# Patient Record
Sex: Male | Born: 1935 | Race: White | Hispanic: No
Health system: Southern US, Community
[De-identification: ages and names within clinical notes are randomized; demographics above are authoritative.]

---

## 2001-08-29 ENCOUNTER — Encounter: Payer: Self-pay | Admitting: Neurosurgery

## 2001-09-01 ENCOUNTER — Encounter: Payer: Self-pay | Admitting: Neurosurgery

## 2001-09-01 ENCOUNTER — Inpatient Hospital Stay (HOSPITAL_COMMUNITY): Admission: RE | Admit: 2001-09-01 | Discharge: 2001-09-02 | Payer: Self-pay | Admitting: Neurosurgery

## 2005-04-09 ENCOUNTER — Ambulatory Visit: Payer: Self-pay | Admitting: Internal Medicine

## 2005-08-02 ENCOUNTER — Emergency Department: Payer: Self-pay | Admitting: Emergency Medicine

## 2006-01-20 ENCOUNTER — Ambulatory Visit: Payer: Self-pay | Admitting: Cardiology

## 2006-02-18 ENCOUNTER — Ambulatory Visit: Payer: Self-pay | Admitting: Unknown Physician Specialty

## 2006-02-22 ENCOUNTER — Ambulatory Visit: Payer: Self-pay | Admitting: Specialist

## 2006-04-10 ENCOUNTER — Emergency Department: Payer: Self-pay | Admitting: Internal Medicine

## 2006-05-03 ENCOUNTER — Ambulatory Visit: Payer: Self-pay | Admitting: Unknown Physician Specialty

## 2006-07-05 ENCOUNTER — Ambulatory Visit: Payer: Self-pay | Admitting: Specialist

## 2006-08-15 ENCOUNTER — Emergency Department: Payer: Self-pay | Admitting: Emergency Medicine

## 2007-01-14 ENCOUNTER — Ambulatory Visit: Payer: Self-pay | Admitting: Internal Medicine

## 2007-02-07 ENCOUNTER — Ambulatory Visit: Payer: Self-pay | Admitting: Internal Medicine

## 2007-03-03 ENCOUNTER — Ambulatory Visit: Payer: Self-pay | Admitting: Vascular Surgery

## 2007-06-16 IMAGING — CT CT ANGIO AORTA RUNOFF
2 of 5 series · 9 of 16 positions shown, 10 images · non-contrast
Comparison: none

REASON FOR EXAM: claudication
COMMENTS:

[Series 4: upperrunoff 5.0 upper · axial · 0.76mm/px · z∈[+156,+606]mm · 4 of 151 slices shown, 5 images]
[im 31/151  soft-tissue]
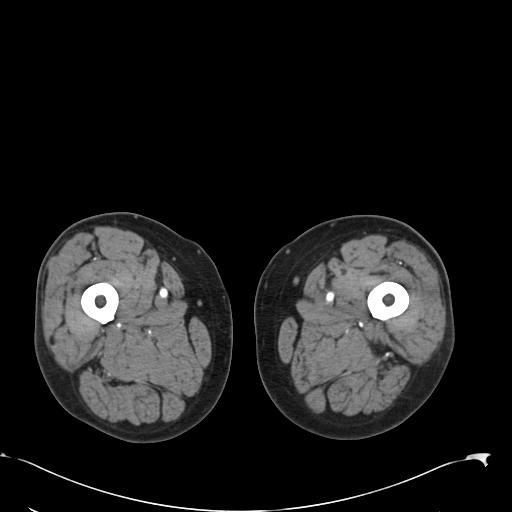
[im 31/151  bone]
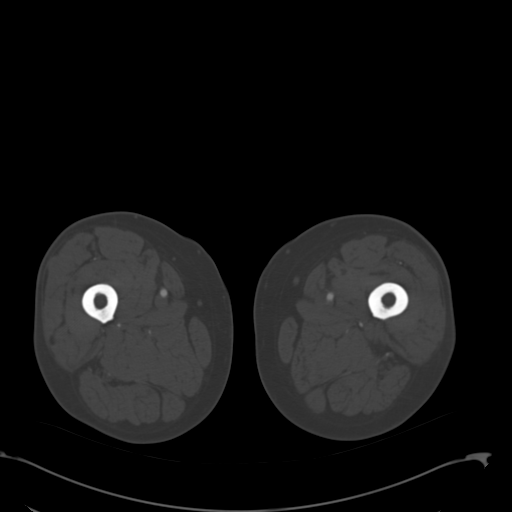
[im 61/151  soft-tissue]
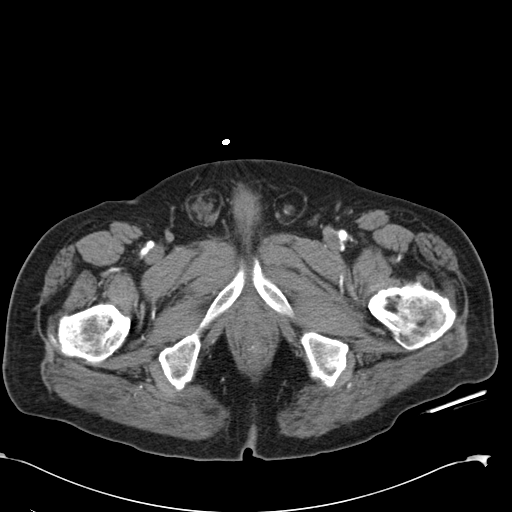
[im 91/151  soft-tissue]
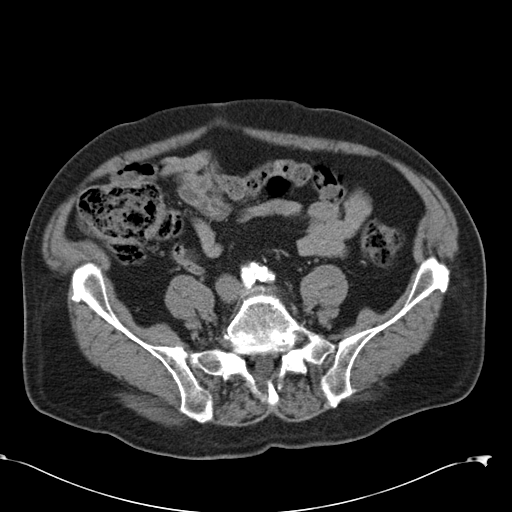
[im 121/151  soft-tissue]
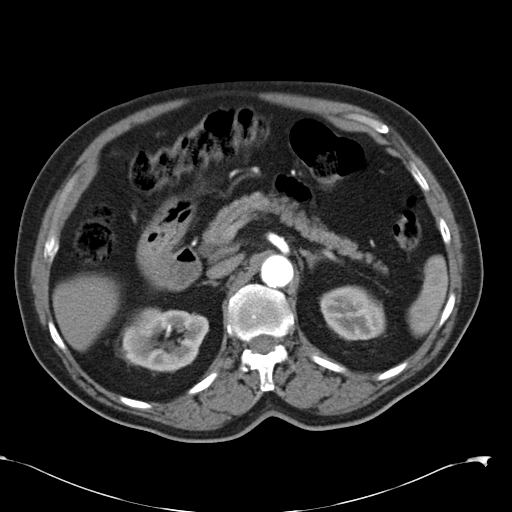

[Series 6: lowerrunoff 5.0 lower · axial · 0.77mm/px · z∈[-512,-37]mm · 5 of 143 slices shown]
[im 24/143  soft-tissue]
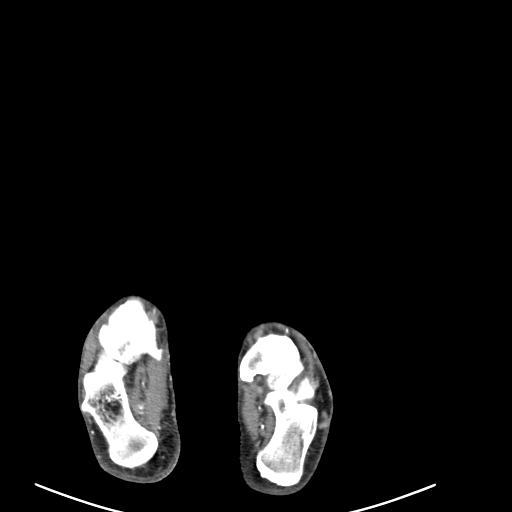
[im 48/143  soft-tissue]
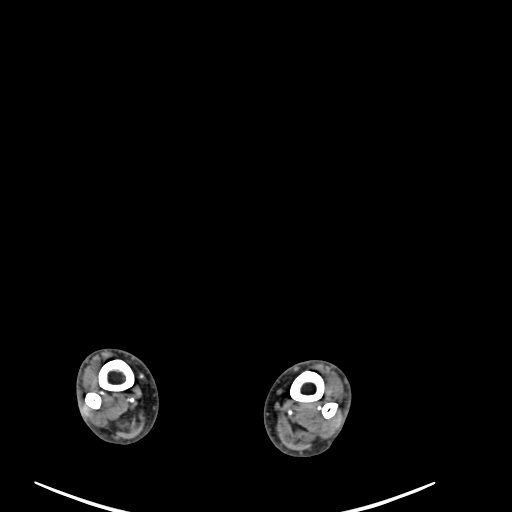
[im 72/143  soft-tissue]
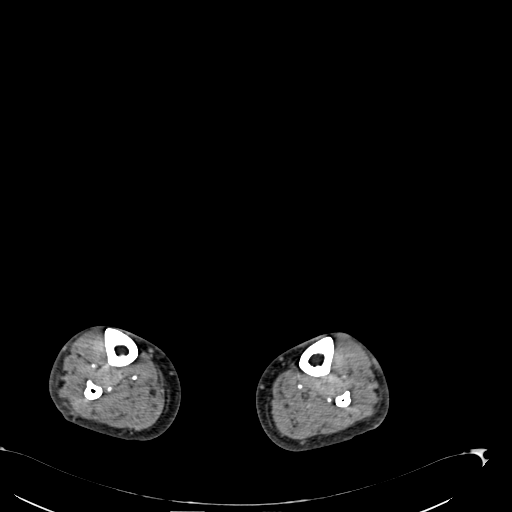
[im 95/143  soft-tissue]
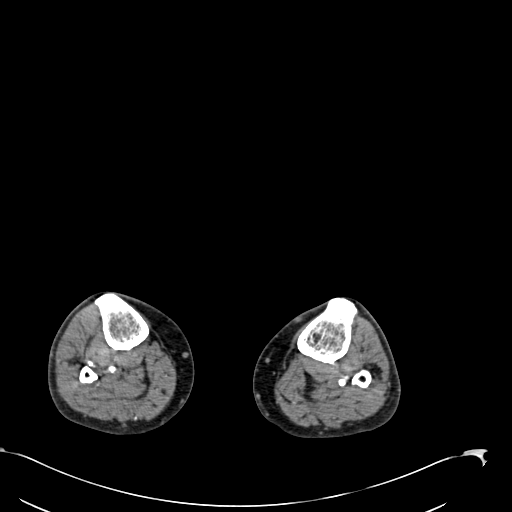
[im 119/143  soft-tissue]
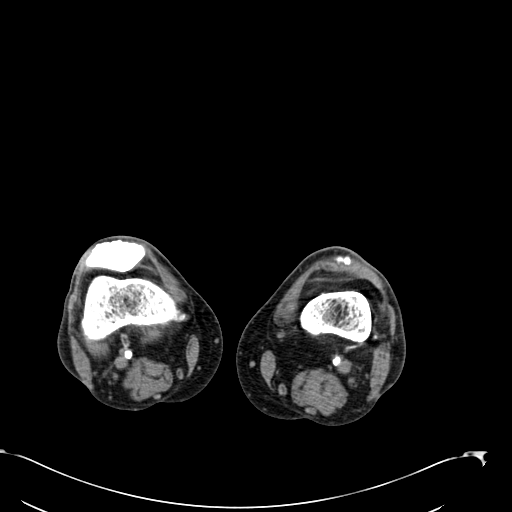

[9 of 16 positions shown; findings below may reference images not displayed]

PROCEDURE:     CT  - CT ANGIOGRAPHY AORTA RUNOFF  - February 07, 2007 [DATE]

RESULT:      Axial source images, 3D reconstructions and 2D maximum
intensity projections were obtained of the abdominal aorta, iliacs and
distal lower extremity vessels.

Evaluation of the abdominal aorta demonstrates diffuse calcified plaques
scattered throughout the aorta.  There is no evidence of an abdominal aortic
aneurysm.  Gross evaluation of the mesenteric vessels demonstrates no gross
abnormalities.  Limited evaluation of the RIGHT and LEFT renal arteries
demonstrates no gross abnormalities.  There does not appear to be evidence
of strictural narrowing within the visualized abdominal aorta.

Evaluation of the RIGHT and LEFT iliac vessels demonstrates coarse mural
calcifications within the RIGHT and LEFT common iliacs.  These
calcifications do partially obscure visualization of the vessel lumen.
Regions of high-grade stenosis particularly in the bifurcation areas at the
takeoff of the internal and external iliac vessels is a diagnostic
consideration which appear to be obscured by the coarse mural
calcifications.  Further evaluation with catheter-directed standard
angiography is recommended if clinically warranted.  Otherwise, there is
diffuse moderate to severe atherosclerotic disease throughout the RIGHT and
LEFT common iliacs. Evaluation of the RIGHT and LEFT internal iliac arteries
demonstrates an area of questionable moderate stenosis at the origin of the
RIGHT internal iliac and possible high-grade stenosis at the origin of the
LEFT internal iliac.  There is diffuse moderate to severe atherosclerotic
disease throughout the proximal and midportion of the vessels as well as
severe atherosclerotic disease within the perforating vessels.  Evaluation
of the external iliacs demonstrates diffuse mural calcifications throughout
both vessels with areas of moderate diffuse atherosclerotic disease.  The
common femoral arteries demonstrate diffuse mural calcifications with
moderate diffuse atherosclerotic disease.  The superficial femoral arteries
appear to be intact with evidence of mild atherosclerotic disease. There is
an area of moderate to severe stenosis at the origin of the profundal
femoral artery on the RIGHT.  Severe atherosclerotic disease is demonstrated
within the perforating vessels.  The LEFT profunda femoral artery appears to
be intact.  Evaluation of the RIGHT popliteal artery demonstrates moderate
diffuse atherosclerotic disease with areas of coarse mural calcifications.
The LEFT popliteal artery demonstrates a segmental area of moderate to
severe stenosis which appears to be on the magnitude of 45 to 60%. Coarse
mural calcifications are identified.  Evaluation of the trifurcation vessels
demonstrates severe atherosclerotic disease throughout the course of the
anterior tibial artery with what appears to be an area of high-grade
stenosis at the origin. This is on the RIGHT. Diffuse moderate to severe
atherosclerotic disease is appreciated involving the posterior
tibial/peroneal trunk with areas of mural calcifications. There is moderate
to severe atherosclerotic disease along the mid and distal portion of the
peroneal artery with occlusion above the level of the ankle. The posterior
tibial artery appears to be patent to and through the level of the ankle.
The LEFT trifurcation vessels demonstrate severe atherosclerotic disease
involving the anterior tibial artery with near complete occlusion of the
proximal portion of the vessel. Mild to moderate disease is demonstrated
throughout the posterior tibial artery which is patent to and through the
level of the ankle.  Severe disease is demonstrated within the distal aspect
of the peroneal artery with an area of complete occlusion along its distal
third with reconstitution just above the level of the ankle.
IMPRESSION: 1.     Diffuse atherosclerotic disease throughout the RIGHT and LEFT lower
extremities with severe atherosclerotic disease involving the trifurcation
vessels as described above particularly the anterior tibial arteries
bilaterally. There appears to be high-grade stenosis at the origin of these
vessels.
2.     Region of moderate to severe atherosclerotic disease is demonstrated
within the popliteal vessels as well as the distal perforating vessels of
the profunda femoral arteries and internal iliac arteries.  Area of
questionable high-grade stenosis is appreciated involving the origins of the
RIGHT and LEFT internal iliac arteries as well as the RIGHT profunda femoral
artery.  These areas are partially obscured by coarse mural calcifications.

3.     A region of moderate to mildly severe segmental narrowing is
demonstrated within the mid to distal portion of the LEFT popliteal artery.
This, too, is partially obscured by mural calcifications.

## 2007-10-21 ENCOUNTER — Emergency Department: Payer: Self-pay | Admitting: Emergency Medicine

## 2007-11-23 ENCOUNTER — Ambulatory Visit: Payer: Self-pay | Admitting: Internal Medicine

## 2009-05-17 ENCOUNTER — Inpatient Hospital Stay: Payer: Self-pay | Admitting: Internal Medicine

## 2009-05-30 ENCOUNTER — Inpatient Hospital Stay: Payer: Self-pay | Admitting: Internal Medicine

## 2009-07-01 ENCOUNTER — Inpatient Hospital Stay: Payer: Self-pay | Admitting: Rheumatology

## 2009-11-07 IMAGING — US ABDOMEN ULTRASOUND
1 series · 17 of 25 positions shown · non-contrast
Comparison: none

REASON FOR EXAM: abdominal pain
COMMENTS:

[Series 1: abdomen ultrasound · 17 of 68 slices shown]
[im 1/68]
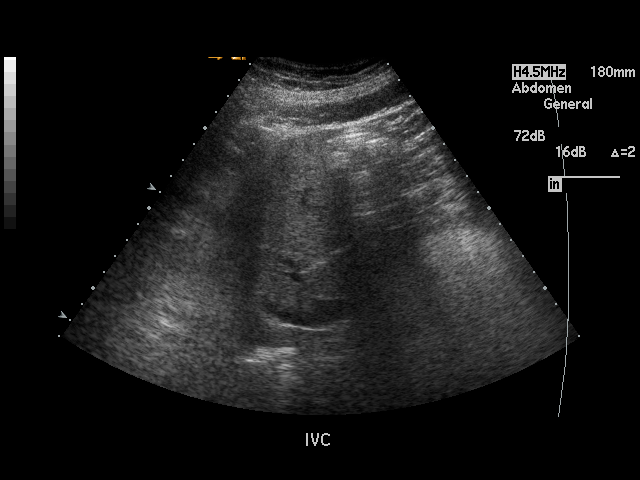
[im 6/68]
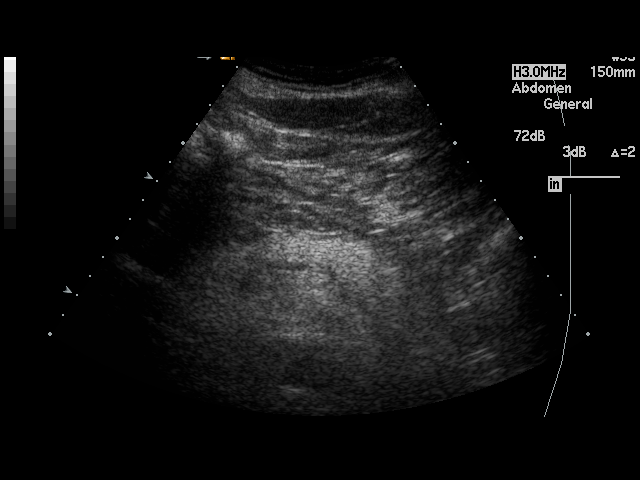
[im 9/68]
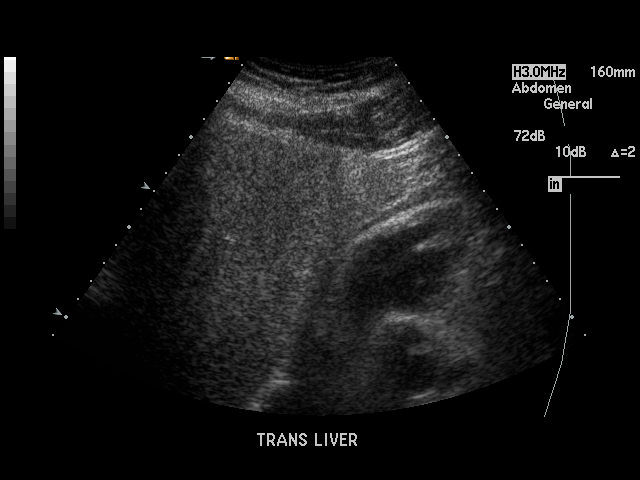
[im 14/68]
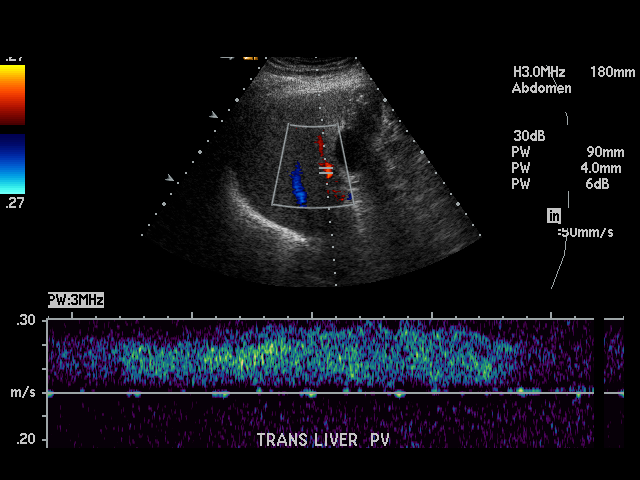
[im 17/68]
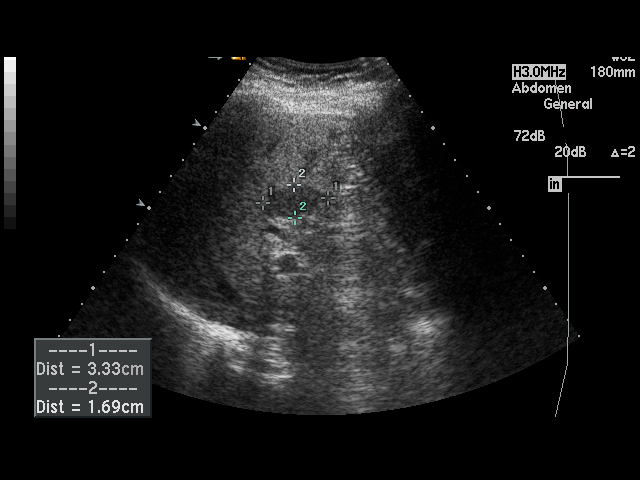
[im 23/68]
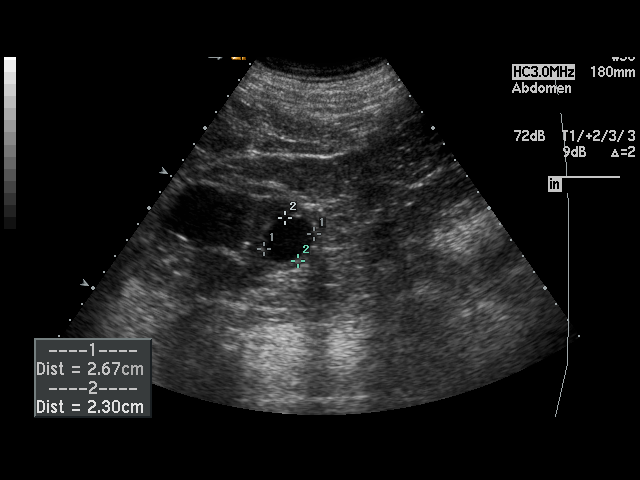
[im 26/68]
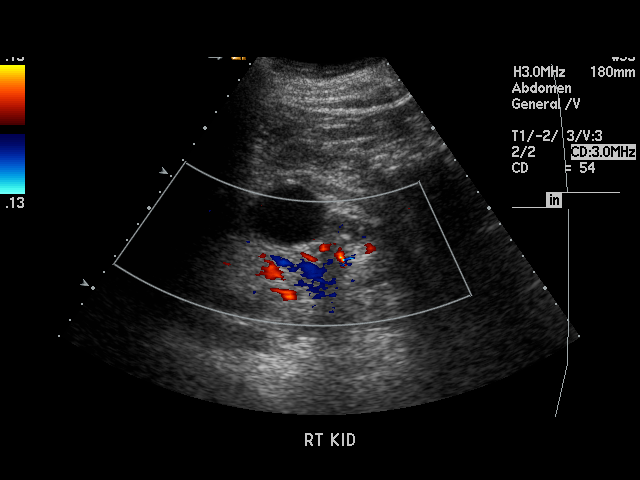
[im 31/68]
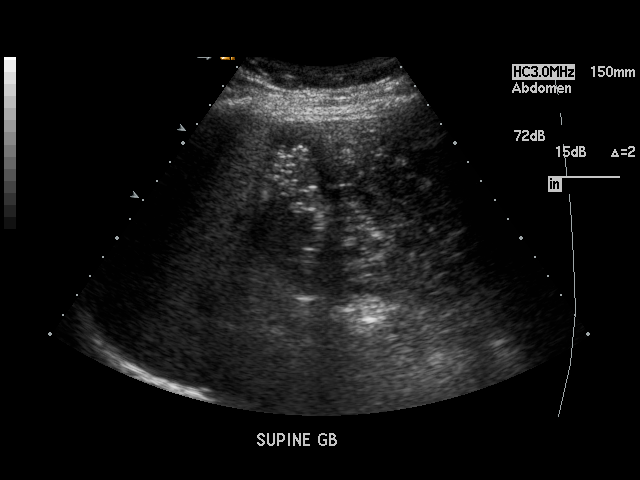
[im 34/68]
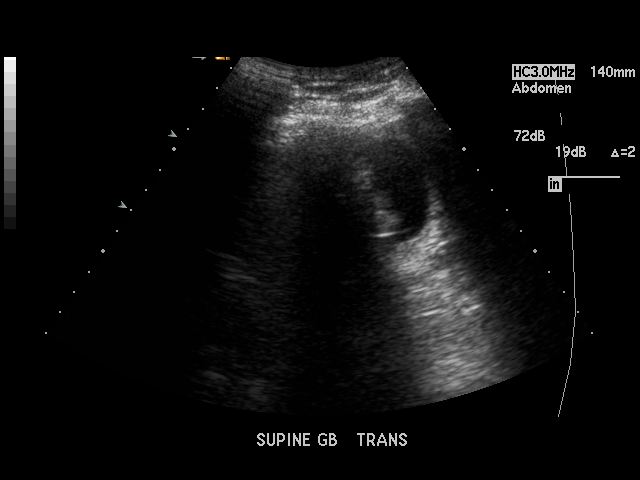
[im 37/68]
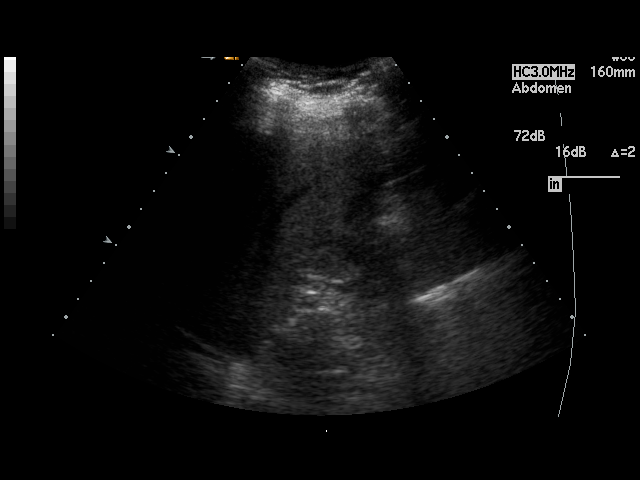
[im 42/68]
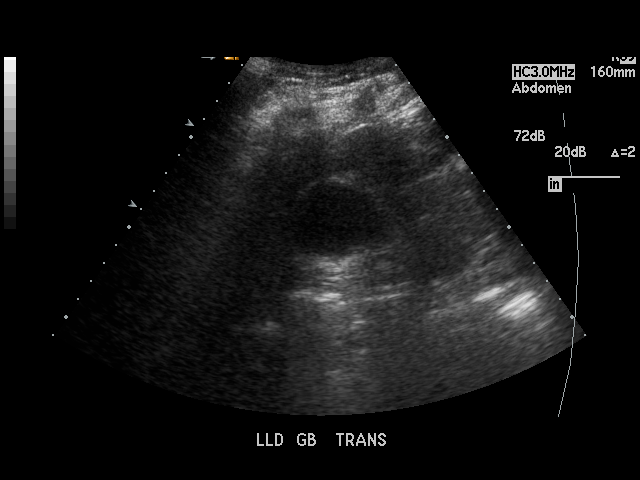
[im 45/68]
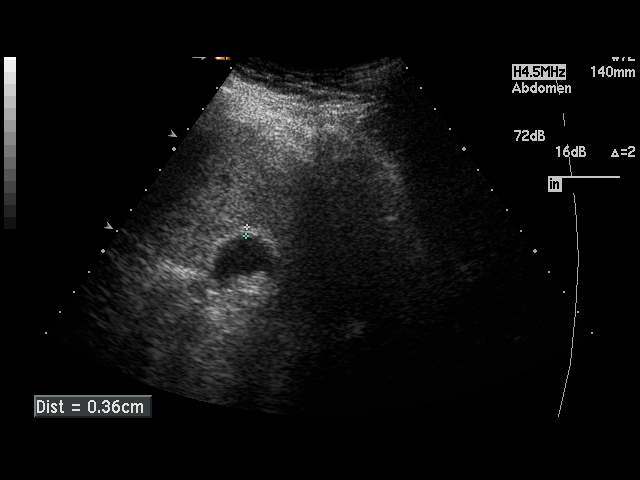
[im 51/68]
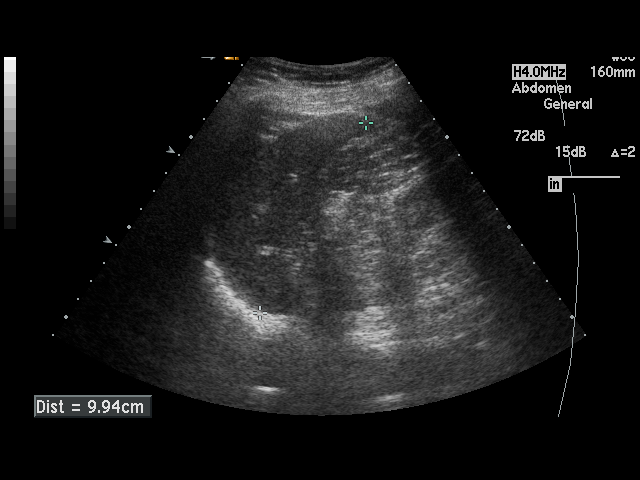
[im 54/68]
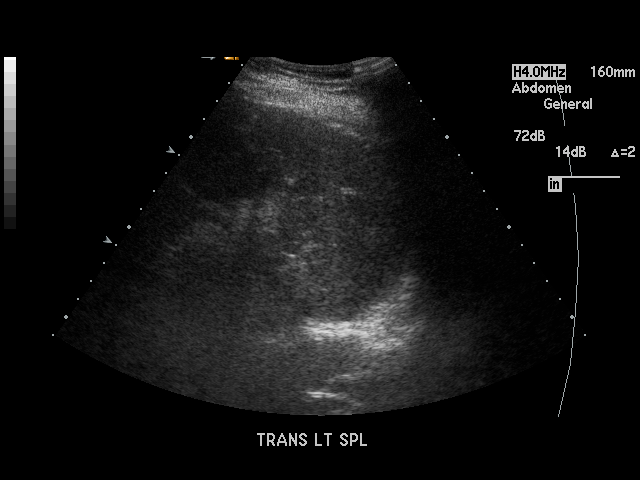
[im 59/68]
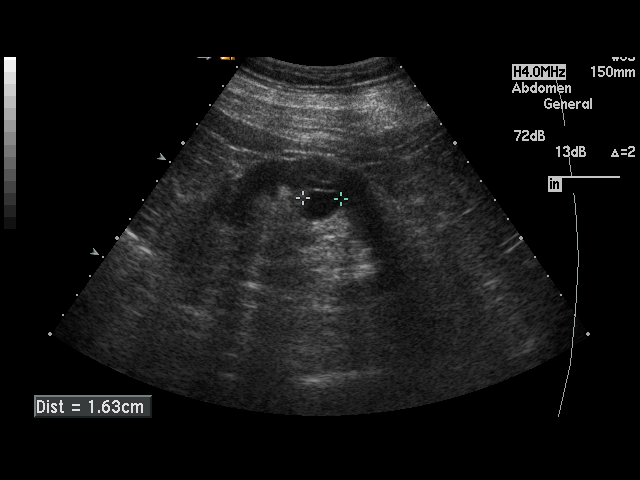
[im 62/68]
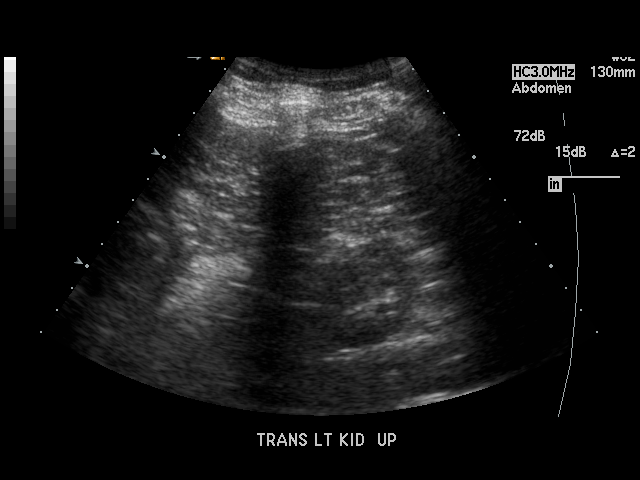
[im 68/68]
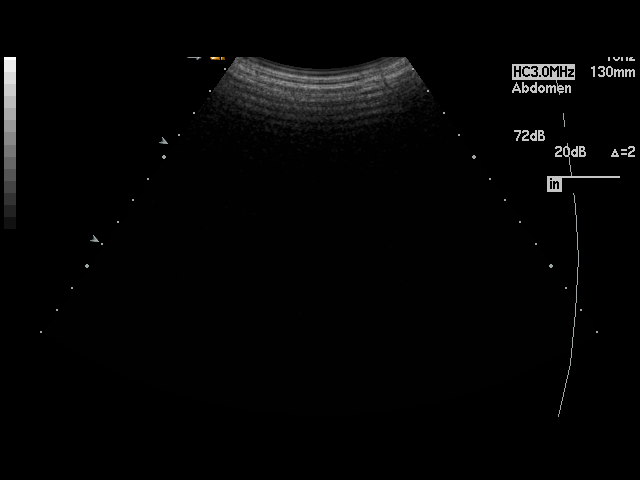

[17 of 25 positions shown; findings below may reference images not displayed]

PROCEDURE:     US  - US ABDOMEN GENERAL SURVEY  - July 01, 2009  [DATE]

RESULT:     Sonographic evaluation of the abdomen is performed. There is no
previous exam for comparison. Visualization of the areas of the aorta
submitted show a maximal AP dimension of 2.5 cm. The included images of the
pancreas show no gross abnormality. There is some low-attenuation in the
liver measuring 3.33 x 1.69 x 2.38 cm near the porta hepatis. Given the
echotexture of the liver is somewhat increased this may represent focal
fatty sparing. Consideration for further investigation with MR could be
given. CT could also be considered. The gallbladder wall shows some focal
thickening up to 4.0 mm. There are stones present within the gallbladder.
These appear to be mobile with changes in patient position. The common bile
duct diameter is 4.8 mm. Multiple cysts are seen in the kidneys. The largest
cyst on the right is 4.7 cm in maximal diameter. Left renal cysts are
present with the largest measuring up to 1.9 cm. There may be a septated
cyst in the lower pole area of the left kidney measuring up to 2.1 cm in
diameter.
IMPRESSION: 1. Fatty infiltration of the liver with an area of decreased echogenicity as
described in the porta hepatis region which may represent focal fatty
sparing. Further investigation is suggested. MRI or triphasic CT could be
considered. Given the septated cystic area in the lower pole the left
kidney, CT may be preferred.
2. Cholelithiasis. There is some focal gallbladder wall thickening to
mm. Correlate clinically. Developing cholecystitis is not completely
excluded. A hepatobiliary scan could be performed for further investigation.

## 2009-11-08 IMAGING — NM NUCLEAR MEDICINE HEPATOHBILIARY INCLUDE GB
1 series · 11 of 11 positions shown · non-contrast
Comparison: none

REASON FOR EXAM: abd pain/abnormal u/s
COMMENTS:

[Series 1000: gallbladder statics · 4.80mm/px · 11 of 11 slices shown]
[im 1/11]
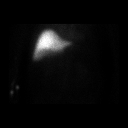
[im 2/11]
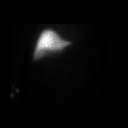
[im 3/11]
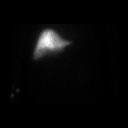
[im 4/11]
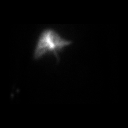
[im 5/11]
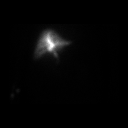
[im 6/11]
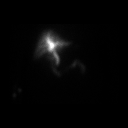
[im 7/11]
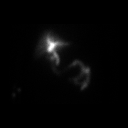
[im 8/11]
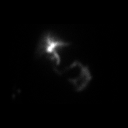
[im 9/11]
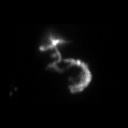
[im 10/11]
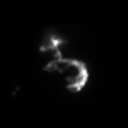
[im 11/11]
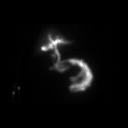

[11 of 11 positions shown; findings below may reference images not displayed]

PROCEDURE:     NM  - NM HEPATOBILIARY IMAGE  - July 02, 2009 [DATE]

RESULT:     The patient received 8.48 mCi of technetium 99m labeled Choletec
for this study. The patient has known gallstones.

There is adequate uptake of the radiopharmaceutical by the liver. Activity
is visible within the central hepatic ducts and common bile duct by
approximately 25 minutes. There may be a small diverticulum from the common
bile duct which  fills versus a small amount of bile reflux into the
duodenal bulb. Gallbladder activity becomes visible by approximately 30
minutes. Bowel activity is visible by 45 minutes.
IMPRESSION: I do not see evidence of cystic duct or common bile duct
obstruction. The patient did not receive CCK due to the presence of stones.
I cannot exclude a common bile duct diverticulum which demonstrates filling
versus reflux into the duodenal bulb.

It may be useful to consider the patient for CT scanning through the abdomen
following administration of oral contrast material only given the patient's
renal failure.  Alternatively if the patient is a candidate for MRI, an MRCP
could be considered.

## 2009-11-30 ENCOUNTER — Inpatient Hospital Stay: Payer: Self-pay | Admitting: Internal Medicine

## 2013-04-12 ENCOUNTER — Inpatient Hospital Stay: Payer: Self-pay | Admitting: Internal Medicine

## 2013-04-12 LAB — URINALYSIS, COMPLETE
Bacteria: NONE SEEN
Bilirubin,UR: NEGATIVE
Blood: NEGATIVE
Glucose,UR: NEGATIVE mg/dL (ref 0–75)
Hyaline Cast: 2
Ketone: NEGATIVE
Nitrite: NEGATIVE
Ph: 7 (ref 4.5–8.0)
Protein: 100
RBC,UR: 4 /HPF (ref 0–5)
Specific Gravity: 1.025 (ref 1.003–1.030)
Squamous Epithelial: <1
WBC UR: 28 /HPF (ref 0–5)

## 2013-04-12 LAB — BASIC METABOLIC PANEL
Anion Gap: 8 (ref 7–16)
BUN: 22 mg/dL — ABNORMAL HIGH (ref 7–18)
Calcium, Total: 9.1 mg/dL (ref 8.5–10.1)
Chloride: 105 mmol/L (ref 98–107)
Co2: 25 mmol/L (ref 21–32)
Creatinine: 1.15 mg/dL (ref 0.60–1.30)
EGFR (African American): >60
EGFR (Non-African Amer.): 58 — ABNORMAL LOW
Glucose: 140 mg/dL — ABNORMAL HIGH (ref 65–99)
Osmolality: 281 (ref 275–301)
Potassium: 4.3 mmol/L (ref 3.5–5.1)
Sodium: 138 mmol/L (ref 136–145)

## 2013-04-12 LAB — PRO B NATRIURETIC PEPTIDE: B-Type Natriuretic Peptide: 842 pg/mL — ABNORMAL HIGH (ref 0–450)

## 2013-04-12 LAB — CBC
HCT: 45.8 % (ref 40.0–52.0)
HGB: 15.4 g/dL (ref 13.0–18.0)
MCH: 27.7 pg (ref 26.0–34.0)
MCHC: 33.7 g/dL (ref 32.0–36.0)
MCV: 82 fL (ref 80–100)
Platelet: 229 10*3/uL (ref 150–440)
RBC: 5.57 10*6/uL (ref 4.40–5.90)
RDW: 13.7 % (ref 11.5–14.5)
WBC: 18.2 10*3/uL — ABNORMAL HIGH (ref 3.8–10.6)

## 2013-04-12 LAB — CK TOTAL AND CKMB (NOT AT ARMC)
CK, Total: 61 U/L (ref 35–232)
CK, Total: 73 U/L (ref 35–232)
CK-MB: 1.4 ng/mL (ref 0.5–3.6)
CK-MB: 1.2 ng/mL (ref 0.5–3.6)

## 2013-04-12 LAB — TROPONIN I
Troponin-I: <0.02 ng/mL
Troponin-I: <0.02 ng/mL

## 2013-04-12 LAB — HEPATIC FUNCTION PANEL A (ARMC)
Albumin: 3.5 g/dL (ref 3.4–5.0)
Alkaline Phosphatase: 110 U/L (ref 50–136)
Bilirubin, Direct: 0.1 mg/dL (ref 0.00–0.20)
Bilirubin,Total: 0.4 mg/dL (ref 0.2–1.0)
SGOT(AST): 41 U/L — ABNORMAL HIGH (ref 15–37)
SGPT (ALT): 58 U/L (ref 12–78)
Total Protein: 7.3 g/dL (ref 6.4–8.2)

## 2013-04-13 LAB — BASIC METABOLIC PANEL
Calcium, Total: 8.6 mg/dL (ref 8.5–10.1)
Chloride: 107 mmol/L (ref 98–107)
Co2: 26 mmol/L (ref 21–32)
Creatinine: 1.15 mg/dL (ref 0.60–1.30)
Osmolality: 287 (ref 275–301)
Potassium: 4 mmol/L (ref 3.5–5.1)
Sodium: 140 mmol/L (ref 136–145)

## 2013-04-13 LAB — CBC WITH DIFFERENTIAL/PLATELET
Basophil #: 0 10*3/uL (ref 0.0–0.1)
Basophil %: 0.1 %
Eosinophil #: 0 10*3/uL (ref 0.0–0.7)
Eosinophil %: 0 %
HCT: 41.1 % (ref 40.0–52.0)
HGB: 13.8 g/dL (ref 13.0–18.0)
Lymphocyte #: 1.3 10*3/uL (ref 1.0–3.6)
Lymphocyte %: 6.1 %
MCH: 27.5 pg (ref 26.0–34.0)
MCHC: 33.7 g/dL (ref 32.0–36.0)
MCV: 82 fL (ref 80–100)
Monocyte #: 1.1 x10 3/mm — ABNORMAL HIGH (ref 0.2–1.0)
Monocyte %: 5.1 %
Neutrophil #: 18.8 10*3/uL — ABNORMAL HIGH (ref 1.4–6.5)
Neutrophil %: 88.7 %
Platelet: 209 10*3/uL (ref 150–440)
RBC: 5.03 10*6/uL (ref 4.40–5.90)
RDW: 13.8 % (ref 11.5–14.5)
WBC: 21.2 10*3/uL — ABNORMAL HIGH (ref 3.8–10.6)

## 2013-04-13 LAB — MAGNESIUM: Magnesium: 2 mg/dL

## 2013-04-14 LAB — WBC: WBC: 18.8 10*3/uL — ABNORMAL HIGH (ref 3.8–10.6)

## 2013-04-15 LAB — CBC WITH DIFFERENTIAL/PLATELET
Basophil #: 0 10*3/uL (ref 0.0–0.1)
Basophil %: 0.3 %
HGB: 14.5 g/dL (ref 13.0–18.0)
Lymphocyte #: 2.1 10*3/uL (ref 1.0–3.6)
Lymphocyte %: 17.3 %
MCH: 27.9 pg (ref 26.0–34.0)
MCHC: 34.5 g/dL (ref 32.0–36.0)
Monocyte %: 10.5 %
Neutrophil #: 8.5 10*3/uL — ABNORMAL HIGH (ref 1.4–6.5)
Neutrophil %: 71.8 %
Platelet: 240 10*3/uL (ref 150–440)
RBC: 5.21 10*6/uL (ref 4.40–5.90)
RDW: 14 % (ref 11.5–14.5)
WBC: 11.9 10*3/uL — ABNORMAL HIGH (ref 3.8–10.6)

## 2013-04-15 LAB — URINE CULTURE

## 2013-04-17 LAB — CULTURE, BLOOD (SINGLE)

## 2013-04-18 LAB — CULTURE, BLOOD (SINGLE)

## 2013-05-08 ENCOUNTER — Ambulatory Visit: Payer: Self-pay | Admitting: Internal Medicine

## 2013-05-14 ENCOUNTER — Inpatient Hospital Stay: Payer: Self-pay | Admitting: Internal Medicine

## 2013-05-14 LAB — COMPREHENSIVE METABOLIC PANEL
Alkaline Phosphatase: 121 U/L (ref 50–136)
Anion Gap: 7 (ref 7–16)
Bilirubin,Total: 0.7 mg/dL (ref 0.2–1.0)
Calcium, Total: 9 mg/dL (ref 8.5–10.1)
Chloride: 103 mmol/L (ref 98–107)
Co2: 23 mmol/L (ref 21–32)
Glucose: 136 mg/dL — ABNORMAL HIGH (ref 65–99)
SGOT(AST): 36 U/L (ref 15–37)
SGPT (ALT): 33 U/L (ref 12–78)
Sodium: 133 mmol/L — ABNORMAL LOW (ref 136–145)
Total Protein: 8 g/dL (ref 6.4–8.2)

## 2013-05-14 LAB — URINALYSIS, COMPLETE
Bacteria: NONE SEEN
Bilirubin,UR: NEGATIVE
Blood: NEGATIVE
Glucose,UR: NEGATIVE mg/dL (ref 0–75)
Hyaline Cast: 2
Ketone: NEGATIVE
Nitrite: NEGATIVE
Specific Gravity: 1.025 (ref 1.003–1.030)
Squamous Epithelial: 1
WBC UR: 4 /HPF (ref 0–5)

## 2013-05-14 LAB — CBC
MCH: 28.3 pg (ref 26.0–34.0)
MCHC: 34.3 g/dL (ref 32.0–36.0)
MCV: 83 fL (ref 80–100)
Platelet: 337 10*3/uL (ref 150–440)
RBC: 5.4 10*6/uL (ref 4.40–5.90)
WBC: 10.3 10*3/uL (ref 3.8–10.6)

## 2013-05-14 LAB — CK TOTAL AND CKMB (NOT AT ARMC): CK, Total: 84 U/L (ref 35–232)

## 2013-05-15 LAB — CK TOTAL AND CKMB (NOT AT ARMC)
CK-MB: 1.3 ng/mL (ref 0.5–3.6)
CK-MB: 1.5 ng/mL (ref 0.5–3.6)

## 2013-05-15 LAB — BASIC METABOLIC PANEL
BUN: 20 mg/dL — ABNORMAL HIGH (ref 7–18)
Calcium, Total: 8.8 mg/dL (ref 8.5–10.1)
Chloride: 104 mmol/L (ref 98–107)
EGFR (African American): 57 — ABNORMAL LOW
Glucose: 143 mg/dL — ABNORMAL HIGH (ref 65–99)
Sodium: 136 mmol/L (ref 136–145)

## 2013-05-16 LAB — CBC WITH DIFFERENTIAL/PLATELET
Basophil %: 0.2 %
HCT: 39.2 % — ABNORMAL LOW (ref 40.0–52.0)
HGB: 13.4 g/dL (ref 13.0–18.0)
MCH: 27.8 pg (ref 26.0–34.0)
Monocyte %: 13.8 %
Neutrophil #: 8.1 10*3/uL — ABNORMAL HIGH (ref 1.4–6.5)
Neutrophil %: 69.1 %
Platelet: 331 10*3/uL (ref 150–440)
RBC: 4.82 10*6/uL (ref 4.40–5.90)
RDW: 14 % (ref 11.5–14.5)
WBC: 11.8 10*3/uL — ABNORMAL HIGH (ref 3.8–10.6)

## 2013-05-16 LAB — BASIC METABOLIC PANEL
Chloride: 102 mmol/L (ref 98–107)
Co2: 27 mmol/L (ref 21–32)
Creatinine: 1.19 mg/dL (ref 0.60–1.30)
EGFR (African American): >60
EGFR (Non-African Amer.): 56 — ABNORMAL LOW

## 2013-05-16 LAB — URINE CULTURE

## 2013-05-17 LAB — BASIC METABOLIC PANEL
Anion Gap: 6 — ABNORMAL LOW (ref 7–16)
Calcium, Total: 9.2 mg/dL (ref 8.5–10.1)
Chloride: 103 mmol/L (ref 98–107)
Co2: 28 mmol/L (ref 21–32)
Creatinine: 1.09 mg/dL (ref 0.60–1.30)
EGFR (Non-African Amer.): >60
Sodium: 137 mmol/L (ref 136–145)

## 2013-05-17 LAB — WBC: WBC: 10.3 10*3/uL (ref 3.8–10.6)

## 2013-05-18 LAB — CBC WITH DIFFERENTIAL/PLATELET
Basophil #: 0 10*3/uL (ref 0.0–0.1)
Basophil %: 0.4 %
Eosinophil %: 0 %
HGB: 14 g/dL (ref 13.0–18.0)
Lymphocyte #: 1.8 10*3/uL (ref 1.0–3.6)
Lymphocyte %: 17.4 %
MCH: 27.6 pg (ref 26.0–34.0)
MCV: 82 fL (ref 80–100)
Monocyte #: 1 x10 3/mm (ref 0.2–1.0)
Neutrophil #: 7.5 10*3/uL — ABNORMAL HIGH (ref 1.4–6.5)
Neutrophil %: 72.4 %
Platelet: 360 10*3/uL (ref 150–440)
RBC: 5.08 10*6/uL (ref 4.40–5.90)
RDW: 14.3 % (ref 11.5–14.5)
WBC: 10.4 10*3/uL (ref 3.8–10.6)

## 2013-05-18 LAB — BASIC METABOLIC PANEL
BUN: 21 mg/dL — ABNORMAL HIGH (ref 7–18)
Calcium, Total: 9.3 mg/dL (ref 8.5–10.1)
Chloride: 104 mmol/L (ref 98–107)
Creatinine: 1.1 mg/dL (ref 0.60–1.30)
EGFR (African American): >60
EGFR (Non-African Amer.): >60
Glucose: 112 mg/dL — ABNORMAL HIGH (ref 65–99)
Osmolality: 279 (ref 275–301)
Potassium: 4 mmol/L (ref 3.5–5.1)
Sodium: 138 mmol/L (ref 136–145)

## 2013-05-19 LAB — CULTURE, BLOOD (SINGLE)

## 2013-06-07 ENCOUNTER — Ambulatory Visit: Payer: Self-pay | Admitting: Internal Medicine

## 2013-08-21 LAB — COMPREHENSIVE METABOLIC PANEL
Albumin: 3.5 g/dL (ref 3.4–5.0)
Anion Gap: 4 — ABNORMAL LOW (ref 7–16)
BUN: 17 mg/dL (ref 7–18)
Bilirubin,Total: 0.4 mg/dL (ref 0.2–1.0)
Calcium, Total: 9 mg/dL (ref 8.5–10.1)
Chloride: 103 mmol/L (ref 98–107)
Co2: 30 mmol/L (ref 21–32)
EGFR (African American): >60
EGFR (Non-African Amer.): 55 — ABNORMAL LOW
Glucose: 105 mg/dL — ABNORMAL HIGH (ref 65–99)
Osmolality: 276 (ref 275–301)
Potassium: 4.3 mmol/L (ref 3.5–5.1)
SGOT(AST): 30 U/L (ref 15–37)
SGPT (ALT): 37 U/L (ref 12–78)
Sodium: 137 mmol/L (ref 136–145)
Total Protein: 7.2 g/dL (ref 6.4–8.2)

## 2013-08-21 LAB — CK TOTAL AND CKMB (NOT AT ARMC)
CK, Total: 50 U/L (ref 35–232)
CK-MB: 1.1 ng/mL (ref 0.5–3.6)

## 2013-08-21 LAB — CBC
MCH: 25.3 pg — ABNORMAL LOW (ref 26.0–34.0)
MCHC: 32.4 g/dL (ref 32.0–36.0)
MCV: 78 fL — ABNORMAL LOW (ref 80–100)
Platelet: 229 10*3/uL (ref 150–440)
RBC: 4.9 10*6/uL (ref 4.40–5.90)

## 2013-08-21 LAB — PRO B NATRIURETIC PEPTIDE: B-Type Natriuretic Peptide: 2540 pg/mL — ABNORMAL HIGH (ref 0–450)

## 2013-08-21 LAB — TROPONIN I: Troponin-I: <0.02 ng/mL

## 2013-08-22 ENCOUNTER — Inpatient Hospital Stay: Payer: Self-pay | Admitting: Internal Medicine

## 2013-08-22 LAB — CK-MB
CK-MB: 1.4 ng/mL (ref 0.5–3.6)
CK-MB: 1.7 ng/mL (ref 0.5–3.6)

## 2013-08-22 LAB — BASIC METABOLIC PANEL
Anion Gap: 5 — ABNORMAL LOW (ref 7–16)
BUN: 16 mg/dL (ref 7–18)
Calcium, Total: 9.6 mg/dL (ref 8.5–10.1)
Chloride: 103 mmol/L (ref 98–107)
Co2: 30 mmol/L (ref 21–32)
EGFR (African American): >60
EGFR (Non-African Amer.): 53 — ABNORMAL LOW
Osmolality: 277 (ref 275–301)
Potassium: 3.7 mmol/L (ref 3.5–5.1)
Sodium: 138 mmol/L (ref 136–145)

## 2013-08-22 LAB — TROPONIN I: Troponin-I: <0.02 ng/mL

## 2013-08-22 LAB — CK: CK, Total: 126 U/L (ref 35–232)

## 2013-08-22 LAB — TSH: Thyroid Stimulating Horm: 2.49 u[IU]/mL

## 2013-08-23 LAB — CBC WITH DIFFERENTIAL/PLATELET
Basophil #: 0.1 10*3/uL (ref 0.0–0.1)
Basophil %: 0.7 %
Eosinophil %: 1.9 %
HGB: 13.8 g/dL (ref 13.0–18.0)
Lymphocyte #: 3.5 10*3/uL (ref 1.0–3.6)
Lymphocyte %: 28.7 %
MCH: 25.7 pg — ABNORMAL LOW (ref 26.0–34.0)
MCHC: 33 g/dL (ref 32.0–36.0)
MCV: 78 fL — ABNORMAL LOW (ref 80–100)
Monocyte #: 1.3 x10 3/mm — ABNORMAL HIGH (ref 0.2–1.0)
Neutrophil #: 7.1 10*3/uL — ABNORMAL HIGH (ref 1.4–6.5)
Neutrophil %: 58.2 %
Platelet: 236 10*3/uL (ref 150–440)
RBC: 5.37 10*6/uL (ref 4.40–5.90)
RDW: 17.5 % — ABNORMAL HIGH (ref 11.5–14.5)
WBC: 12.1 10*3/uL — ABNORMAL HIGH (ref 3.8–10.6)

## 2013-08-23 LAB — BASIC METABOLIC PANEL
Anion Gap: 4 — ABNORMAL LOW (ref 7–16)
Calcium, Total: 9.8 mg/dL (ref 8.5–10.1)
Chloride: 99 mmol/L (ref 98–107)
Co2: 34 mmol/L — ABNORMAL HIGH (ref 21–32)
Creatinine: 1.37 mg/dL — ABNORMAL HIGH (ref 0.60–1.30)
Glucose: 96 mg/dL (ref 65–99)
Osmolality: 279 (ref 275–301)

## 2013-08-24 LAB — BASIC METABOLIC PANEL
Anion Gap: 7 (ref 7–16)
BUN: 38 mg/dL — ABNORMAL HIGH (ref 7–18)
Chloride: 100 mmol/L (ref 98–107)
Co2: 28 mmol/L (ref 21–32)
Creatinine: 1.26 mg/dL (ref 0.60–1.30)
Glucose: 106 mg/dL — ABNORMAL HIGH (ref 65–99)
Potassium: 4.2 mmol/L (ref 3.5–5.1)
Sodium: 135 mmol/L — ABNORMAL LOW (ref 136–145)

## 2013-11-05 DEATH — deceased

## 2014-12-28 NOTE — H&P (Signed)
PATIENT NAME:  Kristen CardinalCAMPBELL, Aahil MR#:  [id number] DATE OF BIRTH:  1926-10-05  DATE OF ADMISSION:  04/12/2013  PRIMARY CARE PHYSICIAN: Curtis SitesBert J. Klein III, MD  CHIEF COMPLAINT: Shortness of breath and fall.   HISTORY OF PRESENT ILLNESS: The patient is an [age] year old Caucasian male who has chronic history of COPD, lives on oxygen on as-needed basis, lives alone, who has been experiencing shortness of breath for 1 day. The patient does not like light, and he usually walks in the dark, and this morning, he sustained a fall, and he was unable to get up from the floor. He pushed his Lifeline, and EMS came by. Son lives just behind his home, and he also came with the EMS to the ER. The patient denies any chest pain, but complaining of shortness of breath. While coming to ER, he was placed on BiPAP machine. The patient started feeling comfortable while on BiPAP. Chest x-ray did not reveal any acute findings, but white count is elevated, and the patient has been having dry cough for the past few days. The patient continues to smoke and still smokes a pipe. Denies any chest pain, nausea, vomiting or abdominal pain. No other complaints. No sick contacts.   PAST MEDICAL HISTORY:  1. Coronary artery disease. 2. GERD. 3. Hyperlipidemia.  4. Hypertension. 5. Peripheral vascular disease.    PAST SURGICAL HISTORY:  1. Cholecystectomy.  2. Cataract repair.  3. Cervical spine surgery with diskectomy and fusion.   ALLERGIES: He has no known drug allergies.   PSYCHOSOCIAL HISTORY: Continues to smoke his pipe. Lives alone. No alcohol, no drugs.   FAMILY HISTORY: Mother had rheumatoid arthritis.   HOME MEDICATIONS:  1. Spiriva 18 mcg inhalation once daily. 2. Sertraline 100 mg once daily.  3. Ranitidine 150 mg twice a day. 4. Nitrostat 0.4 mg sublingually every 5 minutes x3 as needed for chest pain. 5. Xopenex 2 puff inhalation 4 times a day. 6. Lipitor 40 mg once daily. 7. Advair 250/50 two puff inhalation  twice a day.   REVIEW OF SYSTEMS: CONSTITUTIONAL: Denies any fever, fatigue, but the patient is very hard of hearing on the right side.  EYES: Denies blurry vision. Status post cataract surgery.  ENT: Denies epistaxis, discharge.  RESPIRATION: Complaining of cough, chronic history of COPD.  CARDIOVASCULAR: No chest pain, palpitations. Denies any dizziness.  GASTROINTESTINAL: No nausea, vomiting, diarrhea.  GENITOURINARY: No dysuria or hematuria. Denies any hernia. ENDOCRINE: No polyuria, nocturia. HEMATOLOGIC AND LYMPHATIC: Denies anemia or easy bruising.  MUSCULOSKELETAL: No joint pain in the neck or back. Denies any gout.  PSYCHIATRIC: No ADD, OCD, bipolar disorder. NEUROLOGIC: Denies any ataxia, dementia.   PHYSICAL EXAMINATION:  VITAL SIGNS: Blood pressure 141/93, respirations 20, pulse 88, pulse oximetry 98% on BiPAP.  GENERAL APPEARANCE: Not in acute distress. Moderately built and moderately nourished. Very hard of hearing on right side. HEENT: Normocephalic. Pupils are equally reacting to light and accommodation. No scleral icterus. No conjunctival injection. No sinus tenderness. No postnasal drip.  NECK: Supple. No JVD. No thyromegaly. Range of motion is intact.  LUNGS: Distant breath sounds. Diffuse wheezing is present. No accessory muscle usage. No anterior chest wall tenderness on palpation.  CARDIAC: S1, S2 normal. Regular rate and rhythm. No murmur.  GASTROINTESTINAL: Soft. Bowel sounds are positive in all 4 quadrants. Nontender, nondistended. No hepatosplenomegaly.  NEUROLOGIC: Awake, alert, oriented x3, but hard of hearing. Motor and sensory are grossly intact. Reflexes are 2+.  EXTREMITIES: No edema. No cyanosis. No clubbing.  SKIN: Warm to touch. Normal turgor. No rashes. No lesions.  MUSCULOSKELETAL: No joint effusion, tenderness or erythema.   DIAGNOSTIC STUDIES: Chest x-ray: No acute findings. A 12-lead EKG: Normal sinus rhythm with first-degree AV block. PR interval  296 msec. No ST-T wave changes. WBC 18.2, hemoglobin 15.4, hematocrit 45.8, platelets 229, MCV is 82. ABG on BiPAP: pH 7.39, pCO2 42, pO2 88, FiO2 35, base excess 0.3, bicarbonate 25.4. LFTs are normal except AST is slightly elevated at 41. CK total 61, CPK-MB 1.4, troponin less than 0.02. Glucose 140. BNP elevated at 842. BUN 22, creatinine 1.15, sodium 138, potassium 4.3, chloride 105, anion gap 8, calcium 9.1, serum osmolality 281.   ASSESSMENT AND PLAN: An 81 year old Caucasian male presenting to the ER with a chief complaint of shortness of breath. Still smokes a pipe. Started feeling better on BiPAP. Will be admitted with the following assessment and plan.   1. Acute exacerbation of chronic obstructive pulmonary disease with acute bronchitis. Admit to monitored floor with telemetry bed. Solu-Medrol will be given 60 mg IV q.6 hours, DuoNeb nebulizer treatments q.6 hours and albuterol p.r.n. The patient is placed on IV Rocephin and Zithromax to cover acute bronchitis.  2. History of coronary artery disease. No chest pain at this time. 3. Gastroesophageal reflux disease. Will provide gastrointestinal prophylaxis.  4. Hypertension. Will resume his home medications and titrate as needed.  5. The patient still smokes. Counseled to quit smoking.   CODE STATUS: The patient is DNR. Son is his medical power of attorney. His name is Corley Maffeo, contact number [phone], which is work number. If we call this number, they will page Mr. Yifan Auker.   Will transfer the patient to [facility] in a.m.   The plan of care was discussed in detail with the patient and son at bedside. They both verbalized understanding of the plan.   TOTAL TIME SPENT ON ADMISSION: 50 minutes.   ____________________________ Ramonita Lab, MD ag:OSi D: 04/12/2013 05:19:51 ET T: 04/12/2013 07:27:29 ET JOB#: [id number]  cc: Ramonita Lab, MD, <Dictator> Ramonita Lab MD ELECTRONICALLY SIGNED 04/18/2013 6:10

## 2014-12-28 NOTE — H&P (Signed)
PATIENT NAME:  Brett Rojas, Brett Rojas MR#:  [id number] DATE OF BIRTH:  08-25-1927  DATE OF ADMISSION:  08/22/2013  PRIMARY CARE PHYSICIAN: Dr. Daniel Nones   REFERRING PHYSICIAN: Dr. Zenda Alpers.   CHIEF COMPLAINT: Shortness of breath.   HISTORY OF PRESENT ILLNESS: The patient is an 81 year old male with a past medical history of chronic obstructive pulmonary disease, hyperlipidemia, coronary artery disease, hypertension, who is presenting to the ER via EMS for acute respiratory distress. The patient was having shortness of breath for the past two days, and has been experiencing swelling in his lower extremities. Tonight, his shortness of breath has been worse and he was feeling tight in his chest. He usually lives on 2 liters of oxygen, but the patient was gasping for breath. Family members called EMS, and the patient was placed on CPAP by EMS, and he was brought into the ER. The patient was tripoding in the EKG and his ABG has revealed a pO2 of 67%.   The patient was initially placed on BiPAP. Chest x-ray has revealed venous congestion. The patient was diagnosed with new onset congestive heart failure and was given Lasix 40 mg IV. A 12-lead EKG initially has revealed accelerated junctional rhythm, but that spontaneously converted back to normal sinus rhythm. Family members were initially here in the ER, but eventually they left and are not available during my examination.   During my examination, the patient started feeling better, and took the BiPAP off, as he was feeling uncomfortable with the mask. On room air, the patient was sating 97% after nebulizer treatments and Lasix IV, as well as BiPAP. The patient denies any chest pain, denies any abdominal pain, nausea, vomiting. He is hard of hearing, but answering questions accurately.   PAST MEDICAL HISTORY: Osteoarthritis, chronic obstructive pulmonary disease. Lives on 2 liters of oxygen at home. Still smokes. Hyperlipidemia, coronary artery disease with  previous history of myocardial infarction and hypertension.   PAST SURGICAL HISTORY: Cervical spine surgery with diskectomy and fusion, eye surgery on both eyes, neck surgery.   ALLERGIES: NO KNOWN DRUG ALLERGIES.   PSYCHOSOCIAL HISTORY: Lives at home. Lives on 2 liters of oxygen. Continues to smoke a pipe. Denies alcohol or illicit drug usage. Lives by himself.   FAMILY HISTORY: Mother has history of rheumatoid arthritis.   HOME MEDICATIONS: Proventil 2 puffs inhalation 4 times a day as needed, tamsulosin 0.4 mg once daily, sertraline 500 mg 1-1/2 tablet once daily, primidone 50 mg 2 times a day, prednisone 10 mg tapering dose, Nitrostat 0.4 mg sublingually every five minutes as needed, Nexium 40 mg once daily, metoprolol tartrate 50 mg twice a day, losartan 50 mg once daily, gabapentin 100 mg once daily, fluticasone 50 mg two spray inhalations once daily, Colace 100 mg once daily, vitamin B12 1000 mcg injection once a month, atorvastatin 80 mg once daily, aspirin 81 mg 2 times a day, amlodipine 10 mg once daily, DuoNeb treatments every four hours as needed for shortness of breath.    REVIEW OF SYSTEMS: CONSTITUTIONAL: Denies any fever or fatigue. Complaining of weight gain, but he does not know how much he gained.  EYES: Denies blurry vision, double vision.  ENT: Denies epistaxis, discharge or nasal drip.  RESPIRATION: Denies cough. Has chronic history of chronic obstructive pulmonary disease. Still continues to smoke.  CARDIOVASCULAR: Feels short of breath, but denies any chest pain, palpitations.  GASTROINTESTINAL: Denies nausea, vomiting, diarrhea.  GENITOURINARY: No dysuria or hematuria.  ENDOCRINE: Denies polyuria, nocturia, thyroid problems.  HEMATOLOGIC  AND LYMPHATIC:  No anemia, easy bruising or bleeding. INTEGUMENT: Denies rash, lesions.  MUSCULOSKELETAL: No joint pain in the neck and back. Denies any gout.  NEUROLOGIC: No CVA, transient ischemic attack, ataxia.  PSYCHIATRIC: No  ADD, OCD.   PHYSICAL EXAMINATION: VITAL SIGNS: Temperature 98.2, pulse 100, respirations initially 30 but during my examination he was breathing at 22 breaths per minute, blood pressure 117/92, pulse oximetry 95% on room air.  GENERAL APPEARANCE: Not in any acute distress. Moderately built and obese.  HEENT: Normocephalic, atraumatic. Pupils are equal, reacting light and accommodation. No scleral icterus. No conjunctival injection. No sinus tenderness. No postnasal drip. Moist mucous membranes.  NECK: Supple. Positive JVD. No thyromegaly. Range of motion is intact.  LUNGS: Decreased breath sounds, positive rales at the bases. Minimal end-expiratory wheezing, moderate air entry. CARDIOVASCULAR: Regular rate and rhythm. No anterior chest wall tenderness on palpation. Positive peripheral edema, 1+.  ABDOMEN: Soft, obese. Bowel sounds are positive in all four quadrants. Nontender, nondistended. No hepatosplenomegaly. No masses felt.  NEUROLOGIC: Awake, alert, oriented x3. Hard of hearing, but following verbal commands appropriately. Cranial nerves II through XII are intact. Motor and sensory are intact. Reflexes are 2+.  EXTREMITIES: 1+ pitting edema is present. No cyanosis. No clubbing.  SKIN: Warm to touch. Normal turgor. No rashes. No lesions.  MUSCULOSKELETAL: No joint effusion, tenderness, erythema.  PSYCHIATRIC: Normal mood and affect.   LABORATORY AND IMAGING STUDIES: Chest x-ray, AP:  Pulmonary venous congestion. BNP is elevated at 2540, glucose 105, BUN 17, creatinine 1.21, sodium 137, potassium 4.3, chloride 103, CO2 30. GFR 55. Anion gap 4. Serum osmolality 276, calcium 9.0. LFTs are normal. First set of cardiac enzymes are normal. WBC 11.4, hemoglobin 12.4, hematocrit 38.4, platelets 229, MCV 78. ABGs on 28% FiO2: pH of 7.47, pCO2 35, pO2 67, base excess 2.1,  bicarbonate is 22.5. A 12-lead EKG has revealed is accelerated junctional rhythm, initially at 106 beats per minute  but eventually  the telemetry has revealed normal sinus rhythm during my examination.   ASSESSMENT AND PLAN: The patient is an 81 year old Caucasian male, presenting to the ER with a chief complaint of shortness of breath for the past two days, with bilateral lower extremity edema and weight gain. Will be admitted with following assessment and plan.   1.  Acute hypoxic respiratory distress from new-onset congestive heart failure. Will admit him to telemetry. Recent echocardiogram in September 2014 has revealed an ejection fraction of 55% to 60%. His congestive heart failure is classified as diastolic. The patient will be given Lasix intravenous q.12 hours, and will continue beta blocker and statin.  2.  Cycle cardiac biomarkers to rule out acute myocardial infarction.  3.  [facility]. BiPAP as needed basis.  4.  We will check daily weights strict intake and output.  5.  Chronic obstructive pulmonary disease exacerbation. We will provide nebulizer treatments and we will continue prednisone tapering pack.  6.  Hypertension. Blood pressure is stable. We will resume his home medication including beta blocker, amlodipine and ACE inhibitor.  7.  Hyperlipidemia. Continue statin.  8.  Coronary artery disease. The patient will be on aspirin, beta blocker and statin. Will cycle cardiac biomarkers.  9.  We will provide gastrointestinal and deep vein thrombosis prophylaxis with Protonix and Lovenox subcutaneous.   CODE STATUS: He is DO NOT RESUSCITATE.   The diagnosis and plan of care were discussed in detail with the patient. The patient will be  transferred to Dr. Daniel Nones in the morning.  TOTAL TIME SPENT ON ADMISSION: 45 minutes,      ____________________________ Ramonita Lab, MD ag:cg D: 08/22/2013 03:08:46 ET T: 08/22/2013 03:59:52 ET JOB#: [id number]  cc: Ramonita Lab, MD, <Dictator> Lynnea Ferrier, MD Ramonita Lab MD ELECTRONICALLY SIGNED 09/03/2013 0:37

## 2014-12-28 NOTE — Discharge Summary (Signed)
PATIENT NAME:  Brett Rojas, NESMITH MR#:  [id number] DATE OF BIRTH:  01/08/1927  DATE OF ADMISSION:  04/12/2013 DATE OF DISCHARGE:  04/15/2013  HISTORY OF PRESENT ILLNESS: Mr. Lazarz is an 81 year old white gentleman with known O2-dependent COPD who presented to the Emergency Room with increasing shortness of breath and respiratory distress. In the ER, he was found to be in acute respiratory failure, and he was placed on BiPAP. Chest x-ray was unremarkable, but his white count was elevated and he, therefore, was admitted for further evaluation and treatment.   PAST MEDICAL HISTORY: Notable for COPD, coronary artery disease, gastroesophageal reflux disease, hyperlipidemia, hypertension and peripheral vascular disease.   PREVIOUS SURGICAL HISTORY: Included a cervical spine diskectomy with fusion, cholecystectomy and previous cataract surgery.   ALLERGIES: No drug allergies.   ADMISSION MEDICATIONS: Were supposed to include Spiriva 1 puff daily. Zoloft 100 mg daily, ranitidine 150 mg b.i.d., Xopenex 2 puffs 4 times a day, Lipitor 40 mg once a day and Advair 250/50 inhaler 2 puffs twice a day.   ADMISSION PHYSICAL EXAMINATION: Revealed blood pressure 141/93, respirations 20 and a pulse of 88. Pulse ox was 98% on BiPAP. Admission exam as described by the admitting physician was most notable for the pulmonary status. He had an increased AP diameter to the chest. The breath sounds were distant. He did have diffuse wheezing. He was not noticed to be using the accessory muscles of inspiration.   LABORATORY AND RADIOLOGY: The patient's admission chest x-ray showed no acute findings. EKG showed a normal sinus rhythm with first-degree AV block. CBC showed a hemoglobin of 15.4 with a hematocrit of 45.8. White count was 18,200. Admission arterial blood gas showed a pH of 7.39. CO2 was 42. PO2 was 88. Liver function studies were notable only for an AST of 41. CPK was normal. Troponin was normal. Blood sugar was 140.  BNP was 842. BUN was 22 with a creatinine 1.15. Electrolytes were normal.   HOSPITAL COURSE: The patient was admitted to the regular medical floor where he was placed on telemetry. He was felt to have an acute exacerbation of chronic obstructive pulmonary disease with acute bronchitis. He was started on IV steroids, IV antibiotics and nebulizer treatments. During the course of his hospitalization, he was seen by physical therapy who actually recommended rehab placement. The patient, however, refused. He did eventually improve, and he was able to ambulate in the room. He initially agreed to be discharged with home health but then decided he did not want that either. Prior to discharge, I did speak with his son who was comfortable taking him home even without home health. During his hospitalization, he was encouraged to stop smoking which he had continued as an outpatient previously.   DISCHARGE DIAGNOSIS: Acute on chronic respiratory failure due to acute emphysema exacerbation.   DISCHARGE MEDICATIONS:  1. Lipitor 40 mg daily.  2. Xopenex 2 puffs 4 times a day as needed.  3. Vitamin B12 1000 mcg orally daily.  4. Flonase 1 puff each nostril daily.  5. Primidone 50 mg 1 b.i.d.   6. Zoloft 100 mg daily.  7. Lopressor 50 mg b.i.d.  8. Ranitidine 150 mg b.i.d.  9. Nexium 40 mg daily.  10. Prednisone 6 day taper.  11. Flomax 0.4 mg daily at bedtime.  12. Gabapentin 1 capsule b.i.d.  13. Spiriva 1 puff daily.  14. Singulair 1 tablet every 12 hours.  15. Levaquin 750 mg every 48 hours for 3 doses.  16. Advair 250/50  inhaler 1 puff b.i.d.   DISCHARGE DISPOSITION: The patient is being discharged on 2 liters of home oxygen. He was discharged on a low-sodium diet. He is to have activity as tolerated with a walker. The patient will be followed up in the office in 1 to 2 weeks with Dr. Graciela HusbandsKlein.   ____________________________ Letta PateJohn B. Danne HarborWalker III, MD jbw:gb D: 04/16/2013 11:44:16 ET T: 04/16/2013 20:55:47  ET JOB#: [id number]  cc: Letta PateJohn B. Danne HarborWalker III, MD, <Dictator> Elmo PuttJOHN B WALKER III MD ELECTRONICALLY SIGNED 04/17/2013 7:30

## 2014-12-28 NOTE — H&P (Signed)
PATIENT NAME:  Brett Rojas, Brett Rojas MR#:  [id number] DATE OF BIRTH:  [birth year]  DATE OF ADMISSION:  05/14/2013  PRIMARY CARE PHYSICIAN:  Dr. Daniel Nones  REFERRING PHYSICIAN:  Dr. Lowella Fairy  CHIEF COMPLAINT:  Shortness of breath, confusion.   HISTORY OF PRESENT ILLNESS:  Brett Rojas is an 81 year old white male with a past medical history of COPD, continued tobacco use, previous history of myocardial infarction, who is brought to the Emergency Department with complaints of shortness of breath since yesterday morning. The patient has been having a chronic cough for the last few months, however, which has been gradually getting worse in the last few days. It is being more of a wet cough. He did not have any fever. Yesterday morning, patient was noted to have severe generalized weakness, unable to stand up and walk to the bathroom. At baseline, patient is able to walk with short, shuffling gait, and is able to walk to the bathroom. However, yesterday patient's family could not make him stand up. As patient continued to have these symptoms, concern about this, and is brought to the Emergency Department. He did not have any chest pain. The patient is ready hard of hearing. The history is mainly obtained from the patient's family, from patient's son, who is at bedside. Per patient's son, patient mostly stays in bed sleeping. Per son, it has been getting difficult making him walk any distance. In the Emergency Department, patient was noted to be hypoxic, with oxygen saturations of low 80s. The patient was started on oxygen, with improvement of the oxygen saturations to mid-90s. This also helped with the patient's mental status. ABG obtained in the Emergency Department showed hypoxemia, with a  PaO2 of 50.   PAST MEDICAL HISTORY: 1.  Osteoarthritis.  2.  COPD.   3.  Continued tobacco use.  4.  Hyperlipidemia.  5.  Coronary artery disease, with a previous history of MI.  6.  Hypertension.   PAST SURGICAL  HISTORY: 1.  Neck surgery.  2.  Eye surgery of both eyes. 3.  Cervical spine surgery, with diskectomy and fusion.   ALLERGIES:  No known drug allergies.   HOME MEDICATIONS:  1.  Xopenex 45 mcg 2 puffs 4 times a day.  2.  Tiotropium 18 mcg once a day.  3.  Tamsulosin 0.4 mg once a day.  4.  Sertraline 100 mg daily.  5.  Zantac 150 mg 2 times a day.  6.  Primidone 50 mg 2 times a day.  7.  Prednisone 60 mg once a day.  8.  Nitrostat 0.4 mg sublingual every 5 minutes.  9.  Nexium 40 mg once a day.  10.  Montelukast 10 mg every 12 hours.  11.  Lopressor 50 mg 2 times a day.  12.  Lipitor 40 mg 2 tablets daily.  13.  Levofloxacin 750 mg every 48 hours. 14.  Neurontin 100 mg 2 times a day. However, patient has not used this in the last 4 to 5 days.  15.  Flonase 1 spray in each nostril.   16.  B12, 1000 mcg IM once a month.  17.  Benzonatate 100 mg every 6 hours.  18.  Advair Diskus 250/50, 1 puff 2 times a day.   SOCIAL HISTORY:  Continues to smoke a pipe. No history of any alcohol or drug use. He lives behind son's house.   FAMILY HISTORY:  Mother with rheumatoid arthritis.   REVIEW OF SYSTEMS: CONSTITUTIONAL: Has generalized weakness, fatigue. Very  hard of hearing.  EYES: No change in vision.  EARS, NOSE, THROAT: No change in hearing.  RESPIRATORY: Has cough with shortness of breath.  CARDIOVASCULAR: No chest pain, palpitations. Has lower extremity swelling.  GASTROINTESTINAL: No nausea, vomiting, abdominal pain.  GENITOURINARY: No dysuria or hematuria.  ENDOCRINE: No polyuria or polydipsia.  HEMATOLOGIC: No easy bruising or bleeding.  SKIN: No rash or lesions.  MUSCULOSKELETAL: No joint pains and aches. NEUROLOGIC:  No weakness or numbness in any part of the body.   PHYSICAL EXAMINATION: GENERAL: This is a well-built, well-nourished obese male lying down in the bed, not in distress.  VITAL SIGNS: Temperature 98.9, pulse 109, blood pressure 200/88, respiratory rate of 24,  oxygen saturation 88% on room air.  HEENT: Head normocephalic, atraumatic.  EYES: No scleral icterus. Conjunctivae normal. Pupils equal and react to light. Mucous membranes moist. No pharyngeal erythema.  NECK:  Supple. No lymphadenopathy. No JVD. No carotid bruit.  CHEST: Has no focal tenderness.  LUNGS:  Bilateral diffuse wheezing or crackles are heard in bilateral lower lobes, with decreased breath sounds.  HEART: S1 and S2. Regular, tachycardia. Has 1+ pitting edema extending up to the knees. Pulses are palpable.  ABDOMEN: Obese. Bowel sounds present. Soft, nontender, nondistended.  SKIN: No rash or lesions.  MUSCULOSKELETAL: Good range of motion in all the extremities.  NEUROLOGIC: The patient is alert, oriented to place, person, but not to time, in no apparent cranial nerve abnormalities. Motor 5/5 in upper and lower extremities.   LABS:  ABG: PH of 7.43, pCO2 of 37, PaO2 of 50.    CMP:   BUN 22, creatinine of 1.48. Troponin less than 0.02. BNP is 535.   CBC is completely within normal limits.   CT head without contrast:  Sclerosis in the right mastoid and opacification of the right mastoid.   ASSESSMENT AND PLAN: [name] is an 81 year old male who comes to the Emergency Department with shortness of breath.   1.  COPD exacerbation. Will continue with the breathing treatments. DuoNeb. Keep the patient on antibiotics, Rocephin and Zithromax.    2.  Hypoxic respiratory failure. This is a combination of congestive heart failure and COPD exacerbation. Sudden onset of worsening of the shortness of breath could be from addition of congestive heart failure. The exacerbating factor could be hypoxemia. Treat both factors with  breathing treatment with Solu-Medrol and Lasix.  3.  Congestive heart failure. We did not have any previous workup done in the records. Will obtain echocardiogram.  4.  Hypertension, poorly controlled. This could be secondary to congestive heart failure. Will also  check cardiac enzymes x 3. Keep the patient on metoprolol and continue with the hydralazine and Lasix.  5.  Debility. Will involve physical therapy and occupational therapy. The patient may benefit from going to a skilled nursing facility. Will involve this, as mentioned above physical therapy.  6.  COPD. The patient was supposed to follow up with Dr. Meredeth IdeFleming as an outpatient. Will request a consult while in the hospital.  7.  Chronic renal insufficiency. Will continue with dialysis and follow up.  8.  Keep the patient on DVT with Lovenox.  TIME SPENT:  50 minutes. More than 50% of the time is spent discussing with the family regarding the patient's care.    ____________________________ Susa GriffinsPadmaja Llewellyn Choplin, MD pv:mr D: 05/14/2013 21:30:33 ET T: 05/14/2013 22:17:14 ET JOB#: [id number]  cc: Susa GriffinsPadmaja Melanye Hiraldo, MD, <Dictator> Lynnea FerrierBert J. Klein III, MD  Susa GriffinsPADMAJA Miller Limehouse MD ELECTRONICALLY SIGNED 05/30/2013  20:57 

## 2014-12-28 NOTE — Discharge Summary (Signed)
PATIENT NAME:  Brett Rojas, Brett Rojas MR#:  [id number] DATE OF BIRTH:  [birth year]  DATE OF ADMISSION:  05/14/2013 DATE OF DISCHARGE:  05/18/2013  FINAL DIAGNOSES: 1. Acute on chronic respiratory failure secondary to pneumonia.  2. Acute chronic obstructive pulmonary disease exacerbation.  3. Hypertension, accelerated.  4. Osteoarthritis.  5. Coronary artery disease.  6. Hyperlipidemia.  7. Gastroesophageal reflux disease.  8. Osteoarthritis.  9. Vitamin D deficiency.   HISTORY AND PHYSICAL: Please see dictated admission history and physical.   HOSPITAL COURSE: The patient was admitted with evidence of acute on chronic respiratory failure. Chest x-ray with evidence of pneumonia. No positive cultures are able to be obtained. He had evidence of chronic obstructive pulmonary disease exacerbation as well, although the patient  always has some wheezing on exam. He was placed on steroids, antibiotics, and inhaled medications, with significant improvement. He was switched over to oral antibiotics and continued to show improvement.   Physical therapy worked with the patient, and noted that he had significant deconditioning, poor balance. This was similar to his previous hospitalization for similar symptoms, at that time we had recommended he go to rehabilitation but the patient had declined. After discussion with the patient and with family members, he was agreeable to go to rehabilitation at this time. Palliative care did see the patient and they felt he was hospice appropriate; however, we would explore this option only if we feel that he is not improving with rehabilitation or if the patient changes his mind as to the direction of his overall care.   At this time, the patient will be discharged to a nursing facility in stable condition with his physical activity be up with assistance with a walker as tolerated. He will be on 2 liters nasal cannula. His diet should be no added salt. Physical therapy and  occupational therapy should evaluate and treat the patient.   He had evidence of opacified mastoid on the right on his initial CT and was seen by ENT, they felt that he could be treated with of the above therapy as well as antibiotic drops in his ear and just felt that he needed to follow-up in the office, so he will follow up with Dr. Elenore RotaJuengel in two weeks. He otherwise will follow up with the nursing home physician.   DISCHARGE MEDICATIONS: 1. Nitroglycerin 0.4 mg sublingually q. 5 minutes x3 as needed for chest pain.  2. Tessalon 100 mg p.o. q.6 hours as needed for cough.  3. Spiriva 1 capsule inhaled daily.  4. Lopressor 50 mg p.o. b.i.d.  5. Primidone 50 mg p.o. b.i.d. for tremor.  6. Sertraline 150 mg p.o. daily.  7. Atorvastatin 80 mg p.o. daily.  8. Tamsulosin 0.4 mg p.o. daily for benign prostatic hypertrophy.  9. Nexium 40 mg p.o. daily.  10. Flonase two sprays each nostril daily for allergic rhinitis.  11. Singulair 10 mg p.o. daily.  12. Vitamin B12 1000 mcg IM every month for vitamin B12 deficiency.  13. Aspirin 81 mg p.o. b.i.d.  14. Fish oil 1000 mg p.o. b.i.d. for hyperlipidemia.  15. Vitamin D 1500 units p.o. daily.  16. Prednisone taper starting at 60 mg p.o. daily, decreasing x 10 mg every two days.  17. Losartan 50 mg p.o. daily.  18. Neurontin 100 mg p.o. at bedtime for peripheral neuropathy.  19. Symbicort 160/4.5, 2 puffs b.i.d.  20. Xopenex HFA metered dose inhaler 2 puffs 4 times a day as needed for wheezing.  21. DuoNeb SVNs 3 mL  inhaled every four hours as needed for severe wheezing.  22. Colace 100 mg p.o. b.i.d., hold if his stools become loose.  23. Ciprodex otic three drops to the right ear every eight hours x7 days.  24. Levaquin 250 mg p.o. daily x12 days to complete 14 day course.   CODE STATUS: The patient is DO NOT RESUSCITATE.   Total time 30 minutes.   ____________________________ Lynnea Ferrier, MD bjk:sg D: 05/18/2013 08:48:00  ET T: 05/18/2013 09:11:09 ET JOB#: [id number]  cc: Cammy Copa, MD Lynnea Ferrier, MD, <Dictator>      Daniel Nones MD ELECTRONICALLY SIGNED 05/28/2013 18:59

## 2014-12-28 NOTE — Consult Note (Signed)
PATIENT NAME:  Brett Rojas, Brett Rojas MR#:  [id number] DATE OF BIRTH:  Nov 12, 1926  OTOLARYNGOLOGY CONSULTATION  DATE OF CONSULTATION:  05/15/2013  CONSULTING PHYSICIAN:  Brett Copa, MD  REQUESTING PHYSICIAN:  Dr. Daniel Rojas.   REASON FOR CONSULTATION: Evaluate the right ear for possible mastoiditis.   HISTORY OF PRESENT ILLNESS: The patient is an 81 year old white male who has had previous COPD, continued tobacco abuse and shortness of breath. He came into the Emergency Room with increasing shortness of breath. He is noted to have hypoxemia. He has been started on some oxygen along with medications to control some congestive heart failure.   He states he has had drainage out of his right ear on and off for many years. He has been hard of hearing, but no real change in his hearing. He seems like he is a little better in his left ear than  his right ear. He is not complaining of any pain in his ear, and he is not on any treatment for his ear. He cannot remember the last time he used any medications for his ear.   PAST MEDICAL HISTORY: Significant for osteoarthritis, COPD, continued tobacco abuse, hyperlipidemia, coronary artery disease with a previous history of MI, hypertension.   DRUG ALLERGIES: None known.   CURRENT MEDICATIONS: As reviewed in the chart.   SOCIAL HISTORY: He continues to smoke at home. No history of alcohol or drug abuse. He lives with his son.   FAMILY HISTORY: His mother had rheumatoid arthritis.   PHYSICAL EXAMINATION: GENERAL: The left ear canal was clear. The eardrums were thin and translucent. Middle ear space was clear. No sign of fluid or inflammation. The right ear canal was mostly clear, but the eardrum was red, was retracted. You cannot see any landmarks on it, and there is a little bit of mucoid drainage in the ear canal. I cannot see any perforations at this point, but I do not see a middle ear space either.  NOSE: This was pretty open and clear. He has nasal  cannulas in. No sign of infection here. His oropharynx was healthy, with no sign of any drainage or infection. No gum disease.  NECK: Negative for any nodes or masses. No thyromegaly.   I reviewed his CT scan which shows his left mastoid to be well-developed and aerated. You can see the vesicles of the middle ear easily. His right side shows a very sclerotic mastoid bone that did not aerate or develop at all. He has no middle ear space seen. It looks like eardrum has just  scarred right back to his promontory. I cannot see any ossicles here at all. I do not see any obvious hidden pockets of infection on CT scan.   IMPRESSION: He has a very sclerotic mastoid bone on the right. He has some irritation in his canal currently. No other sign of ear, nose, or throat problems.   PLAN: I would start him on Ciprodex drops, 3 to 4 drops in the right ear 3 times a day to help control any inflammation in the ear canal and along the scarred-down eardrum. It does not appear as though there any the middle ear space at all as he has had chronic problems throughout his lifetime.   He does not need any oral medications, as this should have no bearing on his respiratory status at this time.   He needs to have followup in the office to do an audiogram and get a better look at his eardrum  once he is stable and able to ambulate a little better. Will schedule the audiogram at that time.   He should also keep water out of his right ear for now.     ____________________________ Brett CopaPaul Rojas. Brett Hargrove, MD phj:dm D: 05/15/2013 12:04:06 ET T: 05/15/2013 12:31:41 ET JOB#: [id number]  cc: Brett CopaPaul Rojas. Barbra Miner, MD, <Dictator> Brett CopaPAUL Rojas Dyllan Kats MD ELECTRONICALLY SIGNED 05/20/2013 20:37

## 2014-12-28 NOTE — Consult Note (Signed)
Brief Consult Note: Diagnosis: Pt with history of oxygen dependent copd, hypertension, hyperlipidemia and preserved lv funciton admitted with progressive sob and evidence of volume overload on cxr.   Patient was seen by consultant.   Recommend further assessment or treatment.   Comments: Pt with hisotry of hypertension, hyperlipidemia, oxygen dependent copd who was admitted with several day histoyr of progressive shortness of breath. Pt conitnues to smoke despite being on oxygen most of the time. He was significantly hypoxic on presentsation which imporved with transient BiPAP. CXR suggests mild volume overload. Recent echo reveals preserved lv funciton suggesting acute on chornic diastollic chf. He is improving with continued oxygen and diuresis. Pt has ruled out for an mi. Hemodynamically stable at present. Woudl continue with diuresis and treatment of reactive airway disease. Will follow with you.  Electronic Signatures: Dalia HeadingFath, Kenneth A (MD)  (Signed 16-Dec-14 19:18)  Authored: Brief Consult Note   Last Updated: 16-Dec-14 19:18 by Dalia HeadingFath, Kenneth A (MD)

## 2014-12-28 NOTE — Discharge Summary (Signed)
PATIENT NAME:  Brett Rojas, Brett Rojas MR#:  [id number] DATE OF BIRTH:  1927-01-14  DATE OF ADMISSION:  08/22/2013 DATE OF DISCHARGE:  08/24/2013   FINAL DIAGNOSES:  1. New-onset atrial fibrillation/atrial flutter.  2. Acute on chronic left-sided diastolic congestive heart failure secondary to #1.  3. Acute chronic obstructive pulmonary disease exacerbation.  4. Acute on chronic respiratory failure, with initial respiratory rate greater than 20, pO2 of 67 on supplemental oxygen.  5. Hyperlipidemia.  6. Coronary artery disease.  7. Hypertension.  8. Osteoarthritis.   HISTORY AND PHYSICAL: Please see dictated admission history and physical.   HOSPITAL COURSE: The patient was admitted and placed on BiPAP after he was noted to have rapid respiratory rate and was desaturating beyond his baseline, consistent with acute respiratory failure. Chest x-ray revealed pulmonary edema, and exam revealed significant wheezing, consistent with acute COPD exacerbation. He was diuresed. He continue on metoprolol, and his heart rate was well controlled with this. Echocardiogram previously which showed preserved LV function, this was performed within the last couple of months. Cardiac enzymes were negative. Cardiology saw the patient, and we discussed anticoagulation options. Initially, he was placed on Lovenox, showed no troubles with this, and decision was made to place him on Xarelto as it is not felt that he will be particularly compliant with Coumadin diet, and it is challenging for the patient to come in to get his INR checked. He tolerated Xarelto without evidence of bleeding.   He was placed on steroids, inhaled medications, and tolerated these well. He had no evidence of fevers, chills or evidence of infection. It was thought likely his COPD exacerbation was secondary to pulmonary edema. He ambulated with physical therapy and was able to go about 40 feet with a rolling walker, which is similar to baseline for the  patient. He is normally followed by hospice, care management saw the patient, and we recommended continuing hospice services.   The patient now feels like he is close to baseline, so will anticipate discharging him to home in stable condition with his physical activity up with a rolling walker with oxygen 2 liters nasal cannula continuously. He is advised to weigh himself daily, calling for more than 2-pound gain in 1 day or 5 pounds in 1 week or increasing signs or symptoms of heart failure. He should follow a 2 gram sodium diet. He will continue 2 liters nasal cannula. He will follow up in office within the next 1 to 2 weeks. Home hospice services were resumed.   DISCHARGE MEDICATIONS:  1. Nitroglycerin 0.4 mg sublingually q.5 minutes x3 p.r.n. chest pain.  2. Lopressor 50 mg p.o. b.i.d.  3. Primidone 50 mg p.o. b.i.d. for tremor.  4. Sertraline 150 mg p.o. daily.  5. Atorvastatin 80 mg p.o. at bedtime.  6. Tamsulosin 0.4 mg p.o. at bedtime for BPH.  7. Nexium 40 mg p.o. daily for dyspepsia.  8. Flonase 2 sprays to each nostril daily for allergic rhinitis.  9. Vitamin B12 at 1000 mcg IM every month for B12 deficiency. 10. Losartan 50 mg p.o. daily. 11. Gabapentin 100 mg p.o. at bedtime for peripheral neuropathy.  12. Xopenex MDI 2 puffs 4 times a day as needed.  13. DuoNeb SVN q.4 hours as needed for wheezing.  14. Colace 100 mg p.o. daily.  15. Vitamin D3 1000 units p.o. daily for vitamin D deficiency.  16. Amlodipine 10 mg p.o. daily.  17. Prednisone 60 mg p.o. daily, decreasing by 10 mg every 2 days until done.  18. Xarelto 20 mg p.o. daily.  19. Spiriva 1 capsule inhaled daily.  20. Symbicort 160/4.5 two puffs p.o. b.i.d.  21. Furosemide 40 mg p.o. every other day, to be taken in the morning.  22. Potassium 10 mEq p.o. every other day to be taken with furosemide.   The patient is advised to stop aspirin as this has been replaced with Xarelto.   CODE STATUS: The patient is do not  resuscitate. He has an out of facility DNR order.    ____________________________ Lynnea FerrierBert J. Lakoda Mcanany III, MD [id number] D: 08/24/2013 07:28:39 ET T: 08/24/2013 08:07:20 ET JOB#: [id number]  cc: Lynnea FerrierBert J. Morton Simson III, MD, <Dictator> Daniel NonesBERT Lenell Lama MD ELECTRONICALLY SIGNED 08/29/2013 12:24

## 2018-09-27 SURGERY — Surgical Case
Anesthesia: Choice
